# Patient Record
Sex: Male | Born: 1965 | Hispanic: Yes | Marital: Married | Smoking: Never smoker
Health system: Southern US, Community
[De-identification: ages and names within clinical notes are randomized; demographics above are authoritative.]

## PROBLEM LIST (undated history)

## (undated) DIAGNOSIS — N200 Calculus of kidney: Secondary | ICD-10-CM

---

## 2012-11-28 ENCOUNTER — Encounter (HOSPITAL_COMMUNITY): Payer: Self-pay | Admitting: *Deleted

## 2012-11-28 ENCOUNTER — Emergency Department (HOSPITAL_COMMUNITY): Payer: Self-pay

## 2012-11-28 ENCOUNTER — Ambulatory Visit (HOSPITAL_COMMUNITY)
Admission: EM | Admit: 2012-11-28 | Discharge: 2012-11-29 | Disposition: A | Discharge status: Home / Self Care | Payer: Self-pay | Attending: Orthopedic Surgery | Admitting: Orthopedic Surgery

## 2012-11-28 ENCOUNTER — Emergency Department (HOSPITAL_COMMUNITY): Payer: Self-pay | Admitting: *Deleted

## 2012-11-28 ENCOUNTER — Encounter (HOSPITAL_COMMUNITY)
Admission: EM | Disposition: A | Discharge status: Home / Self Care | Payer: Self-pay | Source: Home / Self Care | Attending: Emergency Medicine

## 2012-11-28 DIAGNOSIS — Y9269 Other specified industrial and construction area as the place of occurrence of the external cause: Secondary | ICD-10-CM | POA: Insufficient documentation

## 2012-11-28 DIAGNOSIS — S80859A Superficial foreign body, unspecified lower leg, initial encounter: Secondary | ICD-10-CM

## 2012-11-28 DIAGNOSIS — Z1810 Retained metal fragments, unspecified: Secondary | ICD-10-CM | POA: Insufficient documentation

## 2012-11-28 DIAGNOSIS — Y990 Civilian activity done for income or pay: Secondary | ICD-10-CM | POA: Insufficient documentation

## 2012-11-28 DIAGNOSIS — W278XXA Contact with other nonpowered hand tool, initial encounter: Secondary | ICD-10-CM | POA: Insufficient documentation

## 2012-11-28 DIAGNOSIS — Z23 Encounter for immunization: Secondary | ICD-10-CM | POA: Insufficient documentation

## 2012-11-28 DIAGNOSIS — S81009A Unspecified open wound, unspecified knee, initial encounter: Secondary | ICD-10-CM | POA: Insufficient documentation

## 2012-11-28 DIAGNOSIS — S91009A Unspecified open wound, unspecified ankle, initial encounter: Secondary | ICD-10-CM | POA: Insufficient documentation

## 2012-11-28 HISTORY — PX: KNEE ARTHROSCOPY: SHX127

## 2012-11-28 HISTORY — PX: I & D EXTREMITY: SHX5045

## 2012-11-28 SURGERY — IRRIGATION AND DEBRIDEMENT EXTREMITY
Anesthesia: General | Site: Knee | Laterality: Right | Wound class: Dirty or Infected

## 2012-11-28 MED ORDER — DIPHENHYDRAMINE HCL 12.5 MG/5ML PO ELIX: 12.5000 mg | ORAL_SOLUTION | ORAL | Status: DC | PRN

## 2012-11-28 MED ORDER — METOCLOPRAMIDE HCL 5 MG/ML IJ SOLN: 5.0000 mg | Freq: Three times a day (TID) | INTRAMUSCULAR | Status: DC | PRN

## 2012-11-28 MED ORDER — ONDANSETRON HCL 4 MG/2ML IJ SOLN: 4.0000 mg | Freq: Four times a day (QID) | INTRAMUSCULAR | Status: DC | PRN

## 2012-11-28 MED ORDER — SODIUM CHLORIDE 0.9 % IV BOLUS (SEPSIS)
1000.0000 mL | Freq: Once | INTRAVENOUS | Status: AC
  Administered 2012-11-28: 1000 mL via INTRAVENOUS

## 2012-11-28 MED ORDER — PROPOFOL 10 MG/ML IV BOLUS
INTRAVENOUS | Status: DC | PRN
  Administered 2012-11-28: 100 mg via INTRAVENOUS

## 2012-11-28 MED ORDER — PROMETHAZINE HCL 25 MG/ML IJ SOLN
6.2500 mg | INTRAMUSCULAR | Status: DC | PRN
Start: 2012-11-28 — End: 2012-11-29

## 2012-11-28 MED ORDER — CEFAZOLIN SODIUM 1-5 GM-% IV SOLN
1.0000 g | Freq: Once | INTRAVENOUS | Status: AC
  Administered 2012-11-28: 1 g via INTRAVENOUS
  Filled 2012-11-28: qty 50

## 2012-11-28 MED ORDER — ASPIRIN EC 325 MG PO TBEC
325.0000 mg | DELAYED_RELEASE_TABLET | Freq: Every day | ORAL | Status: DC
  Administered 2012-11-29: 325 mg via ORAL
  Filled 2012-11-28 (×2): qty 1

## 2012-11-28 MED ORDER — OXYCODONE HCL 5 MG/5ML PO SOLN: 5.0000 mg | Freq: Once | ORAL | Status: AC | PRN

## 2012-11-28 MED ORDER — DOCUSATE SODIUM 100 MG PO CAPS
100.0000 mg | ORAL_CAPSULE | Freq: Two times a day (BID) | ORAL | Status: DC
  Administered 2012-11-28 – 2012-11-29 (×2): 100 mg via ORAL
  Filled 2012-11-28 (×2): qty 1

## 2012-11-28 MED ORDER — MORPHINE SULFATE 4 MG/ML IJ SOLN
6.0000 mg | Freq: Once | INTRAMUSCULAR | Status: AC
  Administered 2012-11-28: 6 mg via INTRAVENOUS
  Filled 2012-11-28: qty 2

## 2012-11-28 MED ORDER — MENTHOL 3 MG MT LOZG: 1.0000 | LOZENGE | OROMUCOSAL | Status: DC | PRN

## 2012-11-28 MED ORDER — SUCCINYLCHOLINE CHLORIDE 20 MG/ML IJ SOLN
INTRAMUSCULAR | Status: DC | PRN
  Administered 2012-11-28: 140 mg via INTRAVENOUS

## 2012-11-28 MED ORDER — ONDANSETRON HCL 4 MG/2ML IJ SOLN
INTRAMUSCULAR | Status: DC | PRN
  Administered 2012-11-28: 4 mg via INTRAVENOUS

## 2012-11-28 MED ORDER — HYDROMORPHONE HCL PF 1 MG/ML IJ SOLN: 0.2500 mg | INTRAMUSCULAR | Status: DC | PRN

## 2012-11-28 MED ORDER — METHOCARBAMOL 500 MG PO TABS: 500.0000 mg | ORAL_TABLET | Freq: Four times a day (QID) | ORAL | Status: DC | PRN

## 2012-11-28 MED ORDER — DEXTROSE 5 % IV SOLN
500.0000 mg | Freq: Four times a day (QID) | INTRAVENOUS | Status: DC | PRN
  Filled 2012-11-28: qty 5

## 2012-11-28 MED ORDER — CEFAZOLIN SODIUM-DEXTROSE 2-3 GM-% IV SOLR
2.0000 g | Freq: Four times a day (QID) | INTRAVENOUS | Status: AC
Start: 2012-11-28 — End: 2012-11-29
  Administered 2012-11-28 – 2012-11-29 (×2): 2 g via INTRAVENOUS
  Filled 2012-11-28 (×2): qty 50

## 2012-11-28 MED ORDER — PHENOL 1.4 % MT LIQD: 1.0000 | OROMUCOSAL | Status: DC | PRN

## 2012-11-28 MED ORDER — ALUM & MAG HYDROXIDE-SIMETH 200-200-20 MG/5ML PO SUSP: 30.0000 mL | ORAL | Status: DC | PRN

## 2012-11-28 MED ORDER — BISACODYL 5 MG PO TBEC: 5.0000 mg | DELAYED_RELEASE_TABLET | Freq: Every day | ORAL | Status: DC | PRN

## 2012-11-28 MED ORDER — ACETAMINOPHEN 10 MG/ML IV SOLN
1000.0000 mg | Freq: Four times a day (QID) | INTRAVENOUS | Status: AC
  Administered 2012-11-28 – 2012-11-29 (×4): 1000 mg via INTRAVENOUS
  Filled 2012-11-28 (×4): qty 100

## 2012-11-28 MED ORDER — MIDAZOLAM HCL 2 MG/2ML IJ SOLN: 0.5000 mg | Freq: Once | INTRAMUSCULAR | Status: AC | PRN

## 2012-11-28 MED ORDER — SENNOSIDES-DOCUSATE SODIUM 8.6-50 MG PO TABS: 1.0000 | ORAL_TABLET | Freq: Every evening | ORAL | Status: DC | PRN

## 2012-11-28 MED ORDER — CELECOXIB 200 MG PO CAPS
200.0000 mg | ORAL_CAPSULE | Freq: Two times a day (BID) | ORAL | Status: DC
  Administered 2012-11-28 – 2012-11-29 (×2): 200 mg via ORAL
  Filled 2012-11-28 (×3): qty 1

## 2012-11-28 MED ORDER — FENTANYL CITRATE 0.05 MG/ML IJ SOLN
50.0000 ug | INTRAMUSCULAR | Status: DC | PRN
  Administered 2012-11-28: 100 ug via INTRAVENOUS

## 2012-11-28 MED ORDER — LACTATED RINGERS IV SOLN
INTRAVENOUS | Status: DC
  Administered 2012-11-28: 14:00:00 via INTRAVENOUS

## 2012-11-28 MED ORDER — OXYCODONE HCL 5 MG PO TABS
5.0000 mg | ORAL_TABLET | ORAL | Status: DC | PRN
  Administered 2012-11-28: 10 mg via ORAL
  Filled 2012-11-28: qty 2

## 2012-11-28 MED ORDER — OXYCODONE HCL 5 MG PO TABS: 5.0000 mg | ORAL_TABLET | Freq: Once | ORAL | Status: AC | PRN

## 2012-11-28 MED ORDER — LACTATED RINGERS IV SOLN
INTRAVENOUS | Status: DC | PRN
  Administered 2012-11-28: 14:00:00 via INTRAVENOUS

## 2012-11-28 MED ORDER — HYDROMORPHONE HCL PF 1 MG/ML IJ SOLN: 0.5000 mg | INTRAMUSCULAR | Status: DC | PRN

## 2012-11-28 MED ORDER — FENTANYL CITRATE 0.05 MG/ML IJ SOLN
INTRAMUSCULAR | Status: DC | PRN
  Administered 2012-11-28: 100 ug via INTRAVENOUS
  Administered 2012-11-28 (×2): 50 ug via INTRAVENOUS

## 2012-11-28 MED ORDER — MIDAZOLAM HCL 5 MG/5ML IJ SOLN
INTRAMUSCULAR | Status: DC | PRN
  Administered 2012-11-28: 2 mg via INTRAVENOUS

## 2012-11-28 MED ORDER — MEPERIDINE HCL 25 MG/ML IJ SOLN: 6.2500 mg | INTRAMUSCULAR | Status: DC | PRN

## 2012-11-28 MED ORDER — SODIUM CHLORIDE 0.9 % IV SOLN
INTRAVENOUS | Status: DC
  Administered 2012-11-28: 75 mL/h via INTRAVENOUS

## 2012-11-28 MED ORDER — MIDAZOLAM HCL 2 MG/2ML IJ SOLN: 1.0000 mg | INTRAMUSCULAR | Status: DC | PRN

## 2012-11-28 MED ORDER — SODIUM CHLORIDE 0.9 % IR SOLN
Status: DC | PRN
  Administered 2012-11-28 (×2): 3000 mL

## 2012-11-28 MED ORDER — ONDANSETRON HCL 4 MG/2ML IJ SOLN
4.0000 mg | Freq: Once | INTRAMUSCULAR | Status: AC
  Administered 2012-11-28: 4 mg via INTRAVENOUS
  Filled 2012-11-28: qty 2

## 2012-11-28 MED ORDER — PHENYLEPHRINE HCL 10 MG/ML IJ SOLN
INTRAMUSCULAR | Status: DC | PRN
  Administered 2012-11-28 (×3): 40 ug via INTRAVENOUS

## 2012-11-28 MED ORDER — METOCLOPRAMIDE HCL 10 MG PO TABS: 5.0000 mg | ORAL_TABLET | Freq: Three times a day (TID) | ORAL | Status: DC | PRN

## 2012-11-28 MED ORDER — ZOLPIDEM TARTRATE 5 MG PO TABS: 5.0000 mg | ORAL_TABLET | Freq: Every evening | ORAL | Status: DC | PRN

## 2012-11-28 MED ORDER — ONDANSETRON HCL 4 MG PO TABS: 4.0000 mg | ORAL_TABLET | Freq: Four times a day (QID) | ORAL | Status: DC | PRN

## 2012-11-28 MED ORDER — ACETAMINOPHEN 650 MG RE SUPP: 650.0000 mg | Freq: Four times a day (QID) | RECTAL | Status: DC | PRN

## 2012-11-28 MED ORDER — ACETAMINOPHEN 325 MG PO TABS: 650.0000 mg | ORAL_TABLET | Freq: Four times a day (QID) | ORAL | Status: DC | PRN

## 2012-11-28 SURGICAL SUPPLY — 56 items
BAG URINE DRAINAGE (UROLOGICAL SUPPLIES) IMPLANT
BANDAGE ELASTIC 6 VELCRO ST LF (GAUZE/BANDAGES/DRESSINGS) ×2 IMPLANT
BANDAGE ESMARK 6X9 LF (GAUZE/BANDAGES/DRESSINGS) ×1 IMPLANT
BLADE SURG 11 STRL SS (BLADE) ×2 IMPLANT
BNDG ESMARK 6X9 LF (GAUZE/BANDAGES/DRESSINGS) ×2
CLOTH BEACON ORANGE TIMEOUT ST (SAFETY) ×2 IMPLANT
CONT SPEC 4OZ CLIKSEAL STRL BL (MISCELLANEOUS) ×4 IMPLANT
COVER SURGICAL LIGHT HANDLE (MISCELLANEOUS) ×2 IMPLANT
CUFF TOURNIQUET SINGLE 34IN LL (TOURNIQUET CUFF) IMPLANT
DRAPE EXTREMITY T 121X128X90 (DRAPE) ×2 IMPLANT
DRAPE INCISE IOBAN 66X45 STRL (DRAPES) IMPLANT
DRAPE PROXIMA HALF (DRAPES) ×2 IMPLANT
DRAPE U-SHAPE 47X51 STRL (DRAPES) ×2 IMPLANT
DRSG ADAPTIC 3X8 NADH LF (GAUZE/BANDAGES/DRESSINGS) ×2 IMPLANT
DRSG PAD ABDOMINAL 8X10 ST (GAUZE/BANDAGES/DRESSINGS) ×4 IMPLANT
DURAPREP 26ML APPLICATOR (WOUND CARE) ×4 IMPLANT
ELECT REM PT RETURN 9FT ADLT (ELECTROSURGICAL) ×2
ELECTRODE REM PT RTRN 9FT ADLT (ELECTROSURGICAL) ×1 IMPLANT
EVACUATOR 1/8 PVC DRAIN (DRAIN) ×2 IMPLANT
GAUZE XEROFORM 1X8 LF (GAUZE/BANDAGES/DRESSINGS) ×2 IMPLANT
GLOVE BIOGEL PI IND STRL 7.5 (GLOVE) IMPLANT
GLOVE BIOGEL PI IND STRL 8.5 (GLOVE) ×2 IMPLANT
GLOVE BIOGEL PI INDICATOR 7.5 (GLOVE)
GLOVE BIOGEL PI INDICATOR 8.5 (GLOVE) ×2
GLOVE SURG ORTHO 7.0 STRL STRW (GLOVE) IMPLANT
GLOVE SURG ORTHO 8.0 STRL STRW (GLOVE) ×4 IMPLANT
GOWN PREVENTION PLUS XLARGE (GOWN DISPOSABLE) ×4 IMPLANT
GOWN STRL NON-REIN LRG LVL3 (GOWN DISPOSABLE) ×4 IMPLANT
HANDPIECE INTERPULSE COAX TIP (DISPOSABLE) ×1
KIT BASIN OR (CUSTOM PROCEDURE TRAY) ×2 IMPLANT
KIT ROOM TURNOVER OR (KITS) ×2 IMPLANT
MANIFOLD NEPTUNE II (INSTRUMENTS) ×2 IMPLANT
NS IRRIG 1000ML POUR BTL (IV SOLUTION) ×2 IMPLANT
PACK GENERAL/GYN (CUSTOM PROCEDURE TRAY) ×2 IMPLANT
PAD ARMBOARD 7.5X6 YLW CONV (MISCELLANEOUS) ×4 IMPLANT
PAD CAST 4YDX4 CTTN HI CHSV (CAST SUPPLIES) ×1 IMPLANT
PADDING CAST COTTON 4X4 STRL (CAST SUPPLIES) ×1
PADDING CAST COTTON 6X4 STRL (CAST SUPPLIES) ×2 IMPLANT
SET ARTHROSCOPY TUBING (MISCELLANEOUS) ×1
SET ARTHROSCOPY TUBING LN (MISCELLANEOUS) ×1 IMPLANT
SET HNDPC FAN SPRY TIP SCT (DISPOSABLE) ×1 IMPLANT
SPONGE GAUZE 4X4 12PLY (GAUZE/BANDAGES/DRESSINGS) ×2 IMPLANT
STAPLER VISISTAT 35W (STAPLE) ×2 IMPLANT
SUCTION FRAZIER TIP 10 FR DISP (SUCTIONS) ×2 IMPLANT
SUT ETHILON 4 0 PS 2 18 (SUTURE) ×2 IMPLANT
SUT VIC AB 0 CTB1 27 (SUTURE) ×4 IMPLANT
SUT VIC AB 1 CT1 27 (SUTURE)
SUT VIC AB 1 CT1 27XBRD ANBCTR (SUTURE) IMPLANT
SUT VIC AB 2-0 CT1 27 (SUTURE) ×2
SUT VIC AB 2-0 CT1 TAPERPNT 27 (SUTURE) ×2 IMPLANT
SYR 20CC LL (SYRINGE) ×4 IMPLANT
SYRINGE 10CC LL (SYRINGE) IMPLANT
TOWEL OR 17X24 6PK STRL BLUE (TOWEL DISPOSABLE) ×2 IMPLANT
TOWEL OR 17X26 10 PK STRL BLUE (TOWEL DISPOSABLE) ×2 IMPLANT
TRAY FOLEY CATH 14FR (SET/KITS/TRAYS/PACK) IMPLANT
WATER STERILE IRR 1000ML POUR (IV SOLUTION) ×6 IMPLANT

## 2012-11-28 NOTE — ED Notes (Signed)
1150 pt shot 3 inch nail to R knee.  Jeans nailed to R knee above R knee.  No bleeding.

## 2012-11-28 NOTE — Transfer of Care (Signed)
Immediate Anesthesia Transfer of Care Note  Patient: Rodney Mitchell  Procedure(s) Performed: Procedure(s) (LRB) with comments: IRRIGATION AND DEBRIDEMENT EXTREMITY (Right) ARTHROSCOPY KNEE (Right) - removal of foreign body  Patient Location: PACU  Anesthesia Type:General  Level of Consciousness: awake, alert  and oriented  Airway & Oxygen Therapy: Patient Spontanous Breathing and Patient connected to face mask oxygen  Post-op Assessment: Report given to PACU RN, Post -op Vital signs reviewed and stable and Patient moving all extremities  Post vital signs: Reviewed and stable  Complications: No apparent anesthesia complications

## 2012-11-28 NOTE — ED Provider Notes (Signed)
History     CSN: [id number]  Arrival date & time 11/28/12  1104   First MD Initiated Contact with Patient 11/28/12 1118      Chief Complaint  Patient presents with  . Knee Injury    nail gun to knee    (Consider location/radiation/quality/duration/timing/severity/associated sxs/prior treatment) HPI  Rodney Mitchell is a 47 y.o. male presents after3 inch nail shot into right knee from nail gun on construction site earlier today. Patient is unable to flex his knee due to increased pain. Pain rated as 5/10. Denies loss of sensation in right leg or decreased range of motion in ankle or hip. States that he may need to have tetanus booster. Denies numbness/paresthesia, shortness of breath, or chest pain. Patient last ate coffee and toast at 7:30 AM.  History reviewed. No pertinent past medical history.  History reviewed. No pertinent past surgical history.  No family history on file.  History  Substance Use Topics  . Smoking status: Never Smoker   . Smokeless tobacco: Not on file  . Alcohol Use: Yes     Comment: every weekend      Review of Systems  Constitutional: Negative for fever.  Respiratory: Negative for shortness of breath.   Cardiovascular: Negative for chest pain and palpitations.  Gastrointestinal: Negative for nausea, vomiting, abdominal pain and diarrhea.  Musculoskeletal: Positive for joint swelling.       Moderate edema noted around nail which is located medially 2 cm above right patella  Skin: Positive for wound.  All other systems reviewed and are negative.    Allergies  Review of patient's allergies indicates no known allergies.  Home Medications  No current outpatient prescriptions on file.  BP 128/71  Pulse 77  Temp 97.5 F (36.4 C) (Oral)  Resp 22  SpO2 98%  Physical Exam  Nursing note and vitals reviewed. Constitutional: He is oriented to person, place, and time. He appears well-developed and well-nourished. No distress.  HENT:  Head:  Normocephalic.  Eyes: Conjunctivae normal and EOM are normal.  Cardiovascular: Normal rate.   Pulmonary/Chest: Effort normal. No stridor.  Musculoskeletal: Normal range of motion.       Right knee: He exhibits swelling and abnormal patellar mobility. tenderness found.       Legs: Neurological: He is alert and oriented to person, place, and time. He has normal strength. No sensory deficit.  Skin:       3 inch nail penetration  Psychiatric: He has a normal mood and affect.    ED Course  Procedures (including critical care time)  Labs Reviewed - No data to display Dg Knee Right Port  11/28/2012  *RADIOLOGY REPORT*  Clinical Data: Nail gun injury.  PORTABLE RIGHT KNEE - 1-2 VIEW  Comparison: None.  Findings: There is a nail within the medial femoral condyle.  No visible fracture.  No joint effusion within the right knee.  IMPRESSION: Nail imbedded within the right medial femoral condyle.   Original Report Authenticated By: Charlett Nose, M.D.      1. Acute foreign body of knee       MDM  X-ray shows the knee fully embedded in the distal femur. Patient will be given a gram of Ancef IV access and pain control. Tetanus was also updated.  Ortho consult from Dr. Sherlean Foot appreciated: He will take the patient to the OR for foreign body removal.           Wynetta Emery, PA-C 11/28/12 1236

## 2012-11-28 NOTE — ED Notes (Signed)
Ortho at bedside, upon finishing consult will move patient to Pod C to hold.

## 2012-11-28 NOTE — ED Notes (Addendum)
Called OR. Ready for patient, gave report to Olegario Messier, Charity fundraiser. Consent obtained. Pt belongings will be sent with brother, Channing Mutters.

## 2012-11-28 NOTE — ED Provider Notes (Signed)
Medical screening examination/treatment/procedure(s) were performed by non-physician practitioner and as supervising physician I was immediately available for consultation/collaboration.  Keyon Liller, MD 11/28/12 2117 

## 2012-11-28 NOTE — Anesthesia Postprocedure Evaluation (Signed)
  Anesthesia Post-op Note  Patient: Rodney Mitchell  Procedure(s) Performed: Procedure(s) (LRB) with comments: IRRIGATION AND DEBRIDEMENT EXTREMITY (Right) ARTHROSCOPY KNEE (Right) - removal of foreign body  Patient Location: PACU  Anesthesia Type:General  Level of Consciousness: awake  Airway and Oxygen Therapy: Patient Spontanous Breathing  Post-op Pain: mild  Post-op Assessment: Post-op Vital signs reviewed  Post-op Vital Signs: Reviewed  Complications: No apparent anesthesia complications

## 2012-11-28 NOTE — Op Note (Signed)
Dictatation Number:  [id number]

## 2012-11-28 NOTE — H&P (Signed)
  Rodney Mitchell MRN:  [id number] DOB/SEX:  [birth year]/male  CHIEF COMPLAINT:  Painful right Knee  HISTORY: Rodney Mitchell is a 47 y.o. male presents after3 inch nail shot into right knee from nail gun on construction site earlier today. Patient is unable to flex his knee due to increased pain. Pain rated as 5/10. Denies loss of sensation in right leg or decreased range of motion in ankle or hip. States that he may need to have tetanus booster. Denies numbness/paresthesia, shortness of breath, or chest pain. Patient last ate coffee and toast at 7:30 AM.      PAST MEDICAL HISTORY: There are no active problems to display for this patient.  History reviewed. No pertinent past medical history. History reviewed. No pertinent past surgical history.   MEDICATIONS:   (Not in a hospital admission)  ALLERGIES:  No Known Allergies  REVIEW OF SYSTEMS:  Pertinent items are noted in HPI.   FAMILY HISTORY:  No family history on file.  SOCIAL HISTORY:   History  Substance Use Topics  . Smoking status: Never Smoker   . Smokeless tobacco: Not on file  . Alcohol Use: Yes     Comment: every weekend     EXAMINATION:  Vital signs in last 24 hours: Temp:  [97.5 F (36.4 C)] 97.5 F (36.4 C) (01/14 1110) Pulse Rate:  [70-82] 70  (01/14 1230) Resp:  [20-22] 20  (01/14 1208) BP: (105-128)/(65-89) 105/65 mmHg (01/14 1230) SpO2:  [97 %-100 %] 97 % (01/14 1230)  Lungs: clear to auscultation bilaterally Heart: regular rate and rhythm, S1, S2 normal, no murmur, click, rub or gallop Abdomen: soft, non-tender; bowel sounds normal; no masses,  no organomegaly Pulses: 2+ and symmetric  Musculoskeletal:  Decreased ROM, swelling foreign object interfering with patellar tracking  Imaging Review  2 view knee shows nail embedded in medial condyle  Assessment/Plan:  right knee pain foreign object  right knee foreign body removal and arthroscopic washout Rodney Mitchell 11/28/2012, 1:50 PM

## 2012-11-28 NOTE — Anesthesia Preprocedure Evaluation (Signed)
Anesthesia Evaluation  Patient identified by MRN, date of birth, ID band Patient awake    Reviewed: Allergy & Precautions, H&P , NPO status , Patient's Chart, lab work & pertinent test results  History of Anesthesia Complications Negative for: history of anesthetic complications  Airway Mallampati: II TM Distance: >3 FB Neck ROM: Full    Dental  (+) Teeth Intact and Dental Advisory Given   Pulmonary neg pulmonary ROS,  breath sounds clear to auscultation  Pulmonary exam normal       Cardiovascular negative cardio ROS  Rhythm:Regular Rate:Normal     Neuro/Psych negative neurological ROS  negative psych ROS   GI/Hepatic negative GI ROS, Neg liver ROS,   Endo/Other  negative endocrine ROS  Renal/GU negative Renal ROS     Musculoskeletal   Abdominal   Peds  Hematology negative hematology ROS (+)   Anesthesia Other Findings   Reproductive/Obstetrics                           Anesthesia Physical Anesthesia Plan  ASA: I and emergent  Anesthesia Plan: General   Post-op Pain Management:    Induction: Intravenous and Rapid sequence  Airway Management Planned: Oral ETT  Additional Equipment:   Intra-op Plan:   Post-operative Plan: Extubation in OR  Informed Consent: I have reviewed the patients History and Physical, chart, labs and discussed the procedure including the risks, benefits and alternatives for the proposed anesthesia with the patient or authorized representative who has indicated his/her understanding and acceptance.   Dental advisory given  Plan Discussed with: CRNA and Surgeon  Anesthesia Plan Comments: (Plan routine monitors, GETA with RSI)        Anesthesia Quick Evaluation

## 2012-11-29 LAB — BASIC METABOLIC PANEL
BUN: 16 mg/dL (ref 6–23)
CO2: 28 mEq/L (ref 19–32)
Chloride: 104 mEq/L (ref 96–112)
GFR calc non Af Amer: >90 mL/min (ref >90)
Glucose, Bld: 110 mg/dL — ABNORMAL HIGH (ref 70–99)
Potassium: 3.8 mEq/L (ref 3.5–5.1)

## 2012-11-29 LAB — CBC
HCT: 38.6 % — ABNORMAL LOW (ref 39.0–52.0)
Hemoglobin: 13.3 g/dL (ref 13.0–17.0)
MCHC: 34.5 g/dL (ref 30.0–36.0)
RBC: 4.43 MIL/uL (ref 4.22–5.81)

## 2012-11-29 MED ORDER — OXYCODONE HCL 5 MG PO TABS: 5.0000 mg | ORAL_TABLET | ORAL | Status: DC | PRN

## 2012-11-29 NOTE — Progress Notes (Signed)
Discharge instructions completed with patient using Teach back. Pateint's primary language is Spanish but he was able to tell me about his pain medication, dressing changes, the follow up appointment, and to keep the incision dry. These are the main teaching points for the discharge teaching. Waiting on crutches to be delivered to patients room!  Patient will then discharged with a wheelchair to private vehicle.

## 2012-11-29 NOTE — Discharge Summary (Signed)
Home Georgena Spurling, MD   Altamese Cabal, PA-C [address] [place], [place], [place]  [address]                             [phone]  PATIENT ID: Rodney Mitchell        MRN:  [id number]          DOB/AGE: [birth year] / 47 y.o.    DISCHARGE SUMMARY  ADMISSION DATE:    11/28/2012 DISCHARGE DATE:   11/29/2012   ADMISSION DIAGNOSIS: Acute foreign body of knee [916.6] NAIL GUN TO KNEE    DISCHARGE DIAGNOSIS:  Foreign body, right knee    ADDITIONAL DIAGNOSIS: Active Problems:  * No active hospital problems. *   History reviewed. No pertinent past medical history.  PROCEDURE: Procedure(s): IRRIGATION AND DEBRIDEMENT EXTREMITY ARTHROSCOPY KNEE on 11/28/2012  CONSULTS:     HISTORY:  See H&P in chart  HOSPITAL COURSE:  Rodney Mitchell is a 47 y.o. admitted on 11/28/2012 and found to have a diagnosis of Foreign body, right knee.  After appropriate laboratory studies were obtained  they were taken to the operating room on 11/28/2012 and underwent Procedure(s): IRRIGATION AND DEBRIDEMENT EXTREMITY ARTHROSCOPY KNEE.   They were given perioperative antibiotics:  Anti-infectives     Start     Dose/Rate Route Frequency Ordered Stop   11/28/12 1900   ceFAZolin (ANCEF) IVPB 2 g/50 mL premix        2 g 100 mL/hr over 30 Minutes Intravenous Every 6 hours 11/28/12 1823 11/29/12 0405   11/28/12 1145   ceFAZolin (ANCEF) IVPB 1 g/50 mL premix        1 g 100 mL/hr over 30 Minutes Intravenous  Once 11/28/12 1130 11/28/12 1249        .  Tolerated the procedure well.  POD #1, allowed out of bed to a chair.  PT for ambulation and exercise program.    .  The remainder of the hospital course was dedicated to ambulation and strengthening.   The patient was discharged on 1 Day Post-Op in  Good condition.  Blood products given:none  DIAGNOSTIC STUDIES: Recent vital signs: Patient Vitals for the past 24 hrs:  BP Temp Pulse Resp SpO2  11/29/12 0730 130/78 mmHg 97.8 F (36.6 C) 72  20  99 %  11/28/12  2332 126/74 mmHg 98.2 F (36.8 C) 73  20  99 %  11/28/12 1754 129/79 mmHg 98.1 F (36.7 C) 76  15  99 %  11/28/12 1724 125/83 mmHg 97.8 F (36.6 C) 66  13  98 %  11/28/12 1715 122/90 mmHg - 70  18  97 %  11/28/12 1700 118/50 mmHg - 67  18  98 %  11/28/12 1645 115/77 mmHg - 67  13  96 %  11/28/12 1630 113/76 mmHg - 70  13  96 %  11/28/12 1615 115/78 mmHg - 65  14  96 %  11/28/12 1600 117/82 mmHg - 69  14  96 %  11/28/12 1545 - - 67  16  97 %  11/28/12 1530 119/78 mmHg - 82  15  97 %  11/28/12 1518 126/84 mmHg 96.8 F (36 C) 85  16  95 %       Recent laboratory studies:  Basename 11/29/12 0801  WBC 7.2  HGB 13.3  HCT 38.6*  PLT 212    Basename 11/29/12 0801  NA 140  K 3.8  CL 104  CO2 28  BUN 16  CREATININE 0.84  GLUCOSE 110*  CALCIUM 8.7   No results found for this basename: INR, PROTIME     Recent Radiographic Studies :  Dg Knee Right Port  11/28/2012  *RADIOLOGY REPORT*  Clinical Data: Nail gun injury.  PORTABLE RIGHT KNEE - 1-2 VIEW  Comparison: None.  Findings: There is a nail within the medial femoral condyle.  No visible fracture.  No joint effusion within the right knee.  IMPRESSION: Nail imbedded within the right medial femoral condyle.   Original Report Authenticated By: Charlett Nose, M.D.     DISCHARGE INSTRUCTIONS: Discharge Orders    Future Orders Please Complete By Expires   Diet - low sodium heart healthy      Call MD / Call 911      Comments:   If you experience chest pain or shortness of breath, CALL 911 and be transported to the hospital emergency room.  If you develope a fever above 101 F, pus (white drainage) or increased drainage or redness at the wound, or calf pain, call your surgeon's office.   Constipation Prevention      Comments:   Drink plenty of fluids.  Prune juice may be helpful.  You may use a stool softener, such as Colace (over the counter) 100 mg twice a day.  Use MiraLax (over the counter) for constipation as needed.   Increase  activity slowly as tolerated         DISCHARGE MEDICATIONS:     Medication List     As of 11/29/2012  1:29 PM    TAKE these medications         oxyCODONE 5 MG immediate release tablet   Commonly known as: Oxy IR/ROXICODONE   Take 1-2 tablets (5-10 mg total) by mouth every 4 (four) hours as needed for pain.        FOLLOW UP VISIT:       Follow-up Information    Follow up with Raymon Mutton, MD. Call on 12/04/2012.   Contact information:   [address] [place] [address] [phone]          DISPOSITION:  home    CONDITION:  {Good  Rodney Mitchell 11/29/2012, 1:29 PM

## 2012-11-29 NOTE — Op Note (Signed)
NAMERENDER, MARLEY NO.:  [id number]  MEDICAL RECORD NO.:  [id number]  LOCATION:  5N22C                        FACILITY:  [facility]  PHYSICIAN:  Mila Homer. Sherlean Foot, M.D. DATE OF BIRTH:  10-01-1966  DATE OF PROCEDURE:  11/28/2012 DATE OF DISCHARGE:                              OPERATIVE REPORT   SURGEON:  Mila Homer. Sherlean Foot, M.D.  ASSISTANT:  None.  ANESTHESIA:  General.  PREOPERATIVE DIAGNOSIS:  Right knee foreign body.  PROCEDURE:  Right knee foreign body removal, arthroscopic irrigation and debridement.  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  I was obvious on x-ray of the nail was into the femur just missed the patella, but intra-articular.  Informed consent was obtained.  DESCRIPTION OF PROCEDURE:  The patient was laid supine and administered general anesthesia.  The right knee was prepped and draped in usual sterile fashion.  I did remove as much debris as possible and cleansed the nail prior to prep.  I then used a pair of pliers and worked the nail out of the femur.  I then ellipsed the edges and then made an arthroscopic incision in inferolateral portal position.  Placed a camera and then lavaged out the nail side.  I did place a small cannula through that nail tract to irrigated the knee and then back that out to irrigate further into the entry site.  I did this with 3000 mL of LR.  I then lavaged and closed the incisions with 4-0 nylon sutures.  Dressed with Xeroform dressing, sponges, sterile Webril, and an Ace wrap.          ______________________________ Mila Homer. Sherlean Foot, M.D.     SDL/MEDQ  D:  11/28/2012  T:  11/29/2012  Job:  [id number]

## 2012-11-29 NOTE — Progress Notes (Signed)
Utilization review completed. Parnell Spieler, RN, BSN. 

## 2012-11-29 NOTE — Progress Notes (Signed)
SPORTS MEDICINE AND JOINT REPLACEMENT  Georgena Spurling, MD   Altamese Cabal, PA-C [address] [place], [place], [place]  [address]                             [phone]   PROGRESS NOTE  Subjective:  negative for Chest Pain  negative for Shortness of Breath  negative for Nausea/Vomiting   negative for Calf Pain  negative for Bowel Movement   Tolerating Diet: yes         Patient reports pain as 4 on 0-10 scale.    Objective: Vital signs in last 24 hours:   Patient Vitals for the past 24 hrs:  BP Temp Pulse Resp SpO2  11/29/12 0730 130/78 mmHg 97.8 F (36.6 C) 72  20  99 %  11/28/12 2332 126/74 mmHg 98.2 F (36.8 C) 73  20  99 %  11/28/12 1754 129/79 mmHg 98.1 F (36.7 C) 76  15  99 %  11/28/12 1724 125/83 mmHg 97.8 F (36.6 C) 66  13  98 %  11/28/12 1715 122/90 mmHg - 70  18  97 %  11/28/12 1700 118/50 mmHg - 67  18  98 %  11/28/12 1645 115/77 mmHg - 67  13  96 %  11/28/12 1630 113/76 mmHg - 70  13  96 %  11/28/12 1615 115/78 mmHg - 65  14  96 %  11/28/12 1600 117/82 mmHg - 69  14  96 %  11/28/12 1545 - - 67  16  97 %  11/28/12 1530 119/78 mmHg - 82  15  97 %  11/28/12 1518 126/84 mmHg 96.8 F (36 C) 85  16  95 %    @flow {1959:LAST@   Intake/Output from previous day:   01/14 0701 - 01/15 0700 In: 1905 [P.O.:480; I.V.:1425] Out: 820 [Urine:800]   Intake/Output this shift:       Intake/Output      01/14 0701 - 01/15 0700 01/15 0701 - 01/16 0700   P.O. 480    I.V. 1425    Total Intake 1905    Urine 800    Blood 20    Total Output 820    Net +1085            LABORATORY DATA:  Basename 11/29/12 0801  WBC 7.2  HGB 13.3  HCT 38.6*  PLT 212    Basename 11/29/12 0801  NA 140  K 3.8  CL 104  CO2 28  BUN 16  CREATININE 0.84  GLUCOSE 110*  CALCIUM 8.7   No results found for this basename: INR, PROTIME    Examination:  General appearance: alert, cooperative and no distress Extremities: Homans sign is negative, no sign of DVT  Wound Exam:  clean, dry, intact   Drainage:  Scant/small amount Serosanguinous exudate  Motor Exam: EHL and FHL Intact  Sensory Exam: Deep Peroneal normal  Vascular Exam:    Assessment:    1 Day Post-Op  Procedure(s) (LRB): IRRIGATION AND DEBRIDEMENT EXTREMITY (Right) ARTHROSCOPY KNEE (Right)  ADDITIONAL DIAGNOSIS:  Active Problems:  * No active hospital problems. *   Acute Blood Loss Anemia   Plan: Occupational Therapy as ordered Weight Bearing as Tolerated (WBAT)  DVT Prophylaxis:  Aspirin  DISCHARGE PLAN: Home  DISCHARGE NEEDS:          Yannis Gumbs 11/29/2012, 1:23 PM

## 2012-11-30 ENCOUNTER — Encounter (HOSPITAL_COMMUNITY): Payer: Self-pay | Admitting: Orthopedic Surgery

## 2014-02-19 IMAGING — CR DG KNEE 1-2V PORT*R*
2 series · 2 of 2 positions shown · non-contrast
Comparison: None.

CLINICAL DATA: Nail gun injury.

PORTABLE RIGHT KNEE - 1-2 VIEW

[ap/obl knee]
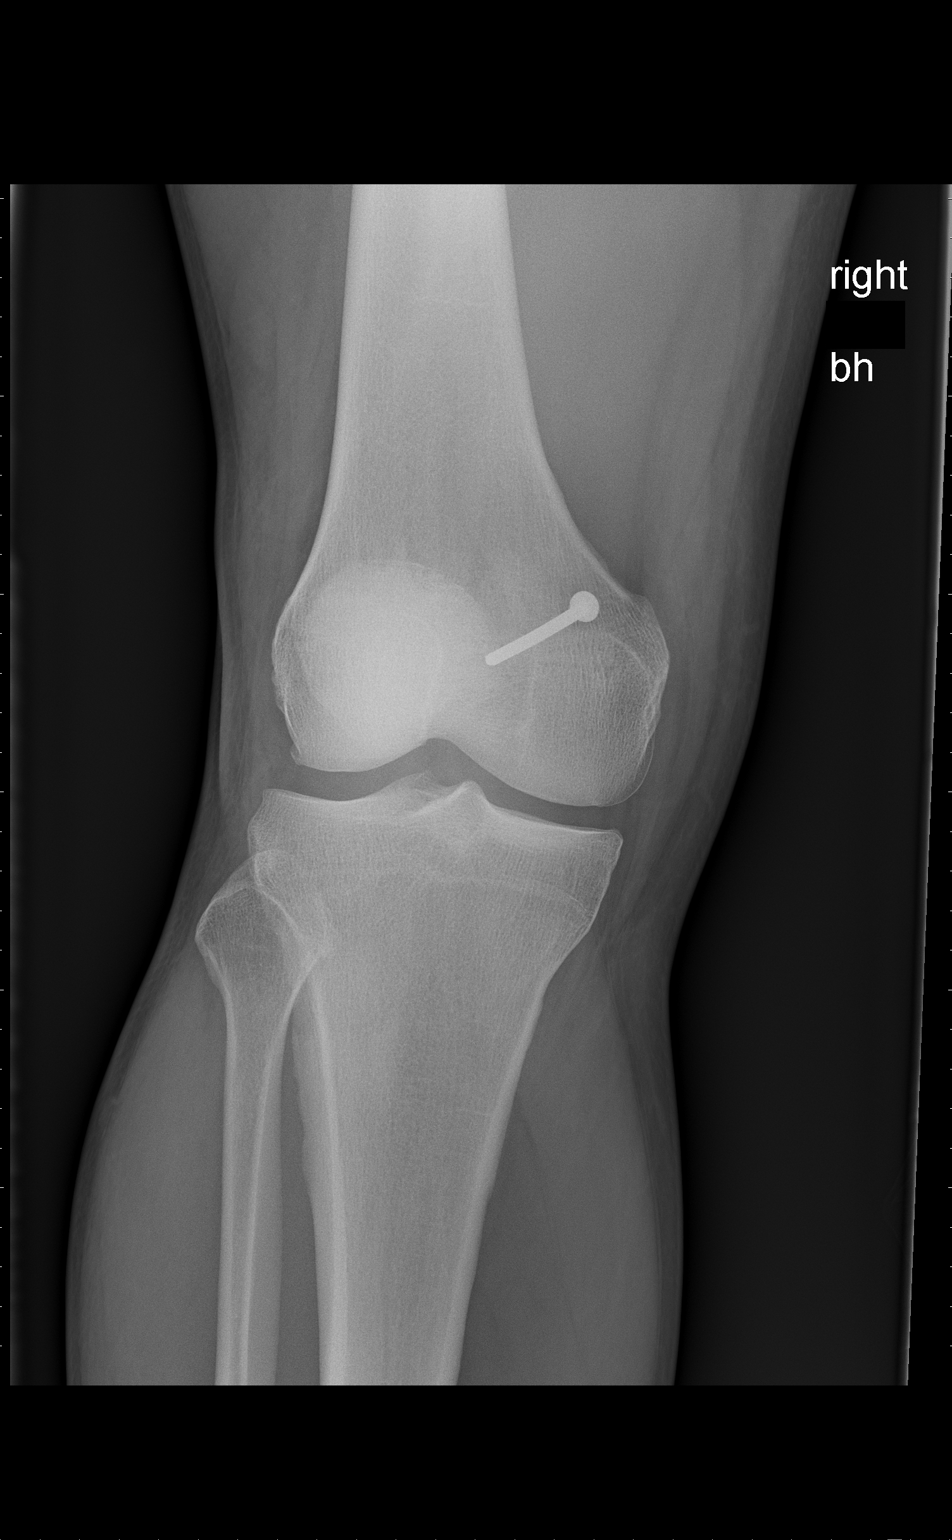

[knee lat]
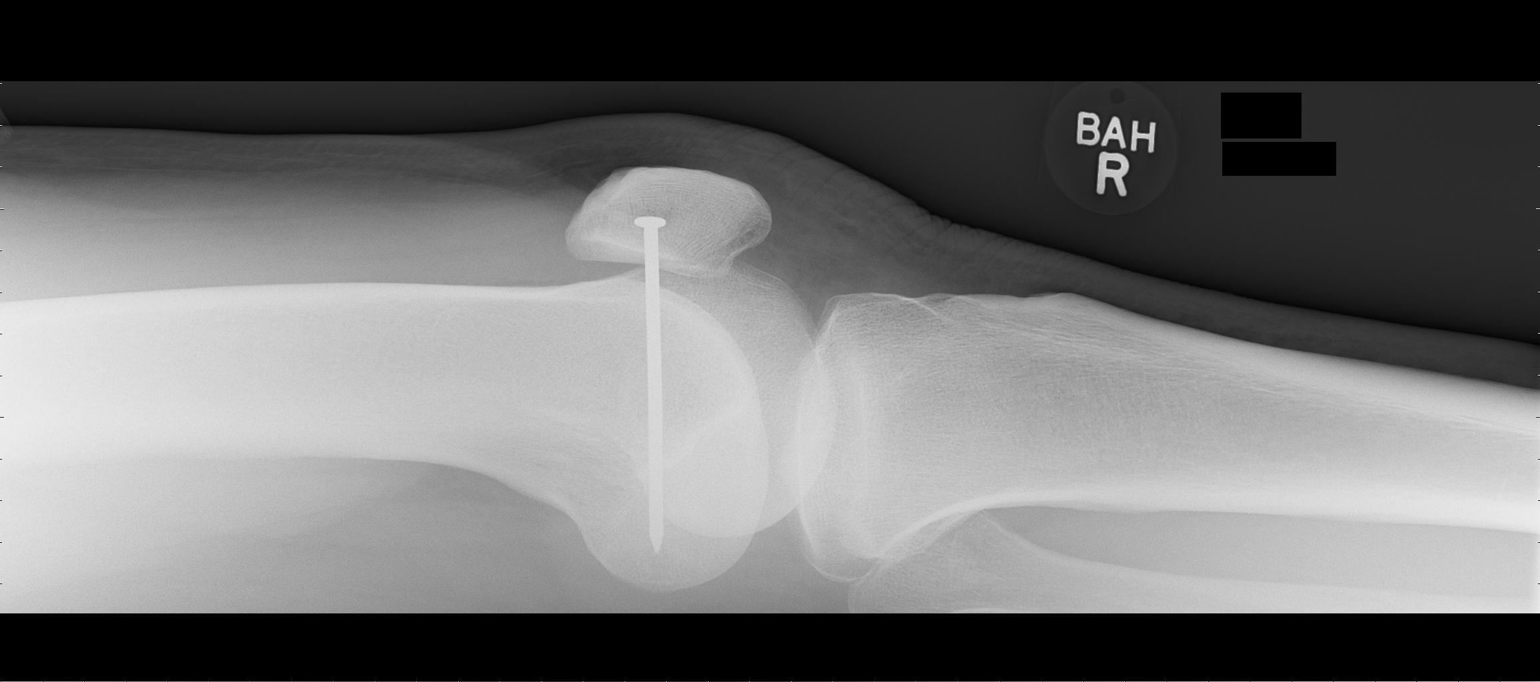

[2 of 2 positions shown; findings below may reference images not displayed]

FINDINGS: There is a nail within the medial femoral condyle.  No
visible fracture.  No joint effusion within the right knee.
IMPRESSION: Nail imbedded within the right medial femoral condyle.

## 2018-05-31 ENCOUNTER — Emergency Department (HOSPITAL_BASED_OUTPATIENT_CLINIC_OR_DEPARTMENT_OTHER): Payer: Self-pay

## 2018-05-31 ENCOUNTER — Other Ambulatory Visit: Payer: Self-pay

## 2018-05-31 ENCOUNTER — Encounter (HOSPITAL_BASED_OUTPATIENT_CLINIC_OR_DEPARTMENT_OTHER): Payer: Self-pay | Admitting: Emergency Medicine

## 2018-05-31 ENCOUNTER — Emergency Department (HOSPITAL_BASED_OUTPATIENT_CLINIC_OR_DEPARTMENT_OTHER)
Admission: EM | Admit: 2018-05-31 | Discharge: 2018-05-31 | Disposition: A | Discharge status: Home / Self Care | Payer: Self-pay | Attending: Emergency Medicine | Admitting: Emergency Medicine

## 2018-05-31 DIAGNOSIS — N201 Calculus of ureter: Secondary | ICD-10-CM | POA: Insufficient documentation

## 2018-05-31 HISTORY — DX: Calculus of kidney: N20.0

## 2018-05-31 LAB — URINALYSIS, MICROSCOPIC (REFLEX)

## 2018-05-31 LAB — URINALYSIS, ROUTINE W REFLEX MICROSCOPIC
Bilirubin Urine: NEGATIVE
Glucose, UA: NEGATIVE mg/dL
HGB URINE DIPSTICK: NEGATIVE
Ketones, ur: NEGATIVE mg/dL
Leukocytes, UA: NEGATIVE
NITRITE: NEGATIVE
PROTEIN: 30 mg/dL — AB
Specific Gravity, Urine: >1.03 — ABNORMAL HIGH (ref 1.005–1.030)
pH: 6 (ref 5.0–8.0)

## 2018-05-31 MED ORDER — KETOROLAC TROMETHAMINE 15 MG/ML IJ SOLN
15.0000 mg | Freq: Once | INTRAMUSCULAR | Status: AC
  Administered 2018-05-31: 15 mg via INTRAVENOUS
  Filled 2018-05-31: qty 1

## 2018-05-31 MED ORDER — HYDROMORPHONE HCL 1 MG/ML IJ SOLN
1.0000 mg | Freq: Once | INTRAMUSCULAR | Status: AC
  Administered 2018-05-31: 1 mg via INTRAVENOUS
  Filled 2018-05-31: qty 1

## 2018-05-31 MED ORDER — SODIUM CHLORIDE 0.9 % IV SOLN
Freq: Once | INTRAVENOUS | Status: AC
  Administered 2018-05-31: 02:00:00 via INTRAVENOUS

## 2018-05-31 MED ORDER — ONDANSETRON 8 MG PO TBDP: 8.0000 mg | ORAL_TABLET | Freq: Three times a day (TID) | ORAL | 0 refills | Status: AC | PRN

## 2018-05-31 MED ORDER — ONDANSETRON HCL 4 MG/2ML IJ SOLN
4.0000 mg | Freq: Once | INTRAMUSCULAR | Status: AC
  Administered 2018-05-31: 4 mg via INTRAVENOUS
  Filled 2018-05-31: qty 2

## 2018-05-31 MED ORDER — HYDROMORPHONE HCL 2 MG PO TABS: 2.0000 mg | ORAL_TABLET | ORAL | 0 refills | Status: AC | PRN

## 2018-05-31 NOTE — ED Provider Notes (Addendum)
[facility] Provider Note: [name] Matty Deamer, MD, FACEP  CSN: [id number] MRN: [id number] ARRIVAL: 05/31/18 at 0156 ROOM: MH10/MH10   CHIEF COMPLAINT  Flank Pain   HISTORY OF PRESENT ILLNESS  05/31/18 2:17 AM Rodney HurstJuan Mitchell is a 52 y.o. male with a history of kidney stones.  He is here with right flank pain that began several hours ago.  The pain is moderate to severe and is characterizes like previous kidney stones.  It is not significantly affected by movement.  He has had associated nausea and vomiting.  He has not noticed hematuria.  He also recently injured his right hand at work and had x-rays of his right hand and wrist yesterday.  The results were reviewed and they show no acute bony abnormality.   Past Medical History:  Diagnosis Date  . Kidney stones     Past Surgical History:  Procedure Laterality Date  . I&D EXTREMITY  11/28/2012   Procedure: IRRIGATION AND DEBRIDEMENT EXTREMITY;  Surgeon: Dannielle HuhSteve Lucey, MD;  Location: [facility] OR;  Service: Orthopedics;  Laterality: Right;  . KNEE ARTHROSCOPY  11/28/2012   Procedure: ARTHROSCOPY KNEE;  Surgeon: Dannielle HuhSteve Lucey, MD;  [facility];  Service: Orthopedics;  Laterality: Right;  removal of foreign body    History reviewed. No pertinent family history.  Social History   Tobacco Use  . Smoking status: Never Smoker  . Smokeless tobacco: Never Used  Substance Use Topics  . Alcohol use: Yes    Comment: every weekend  . Drug use: No    Prior to Admission medications   Medication Sig Start Date End Date Taking? Authorizing Provider  oxyCODONE (OXY IR/ROXICODONE) 5 MG immediate release tablet Take 1-2 tablets (5-10 mg total) by mouth every 4 (four) hours as needed for pain. 11/29/12   Altamese CabalJones, Maurice, PA-C    Allergies Patient has no known allergies.   REVIEW OF SYSTEMS  Negative except as noted here or in the History of Present Illness.   PHYSICAL EXAMINATION  Initial Vital Signs Blood pressure (!) 152/98, pulse  72, temperature 98.1 F (36.7 C), temperature source Oral, resp. rate 20, height 5\' 10"  (1.778 m), weight 111.1 kg (245 lb), SpO2 98 %.  Examination General: Well-developed, well-nourished male in no acute distress; appearance consistent with age of record HENT: normocephalic; atraumatic Eyes: pupils equal, round and reactive to light; extraocular muscles intact Neck: supple Heart: regular rate and rhythm Lungs: clear to auscultation bilaterally Abdomen: soft; nondistended; nontender; bowel sounds present GU: No CVA tenderness Extremities: No deformity; tenderness and swelling of dorsal right hand Neurologic: Awake, alert; motor function intact in all extremities and symmetric; no facial droop Skin: Warm and dry Psychiatric: Flat affect   RESULTS  Summary of this visit's results, reviewed by myself:   EKG Interpretation  Date/Time:    Ventricular Rate:    PR Interval:    QRS Duration:   QT Interval:    QTC Calculation:   R Axis:     Text Interpretation:        Laboratory Studies: Results for orders placed or performed during the hospital encounter of 05/31/18 (from the past 24 hour(s))  Urinalysis, Routine w reflex microscopic     Status: Abnormal   Collection Time: 05/31/18  3:45 AM  Result Value Ref Range   Color, Urine YELLOW YELLOW   APPearance CLEAR CLEAR   Specific Gravity, Urine >1.030 (H) 1.005 - 1.030   pH 6.0 5.0 - 8.0   Glucose, UA NEGATIVE NEGATIVE mg/dL  Hgb urine dipstick NEGATIVE NEGATIVE   Bilirubin Urine NEGATIVE NEGATIVE   Ketones, ur NEGATIVE NEGATIVE mg/dL   Protein, ur 30 (A) NEGATIVE mg/dL   Nitrite NEGATIVE NEGATIVE   Leukocytes, UA NEGATIVE NEGATIVE  Urinalysis, Microscopic (reflex)     Status: Abnormal   Collection Time: 05/31/18  3:45 AM  Result Value Ref Range   RBC / HPF 0-5 0 - 5 RBC/hpf   WBC, UA 0-5 0 - 5 WBC/hpf   Bacteria, UA RARE (A) NONE SEEN   Squamous Epithelial / LPF 0-5 0 - 5   Mucus PRESENT    Amorphous Crystal PRESENT     Imaging Studies: Ct Renal Stone Study  Result Date: 05/31/2018 CLINICAL DATA:  52 year old male with right flank pain. EXAM: CT ABDOMEN AND PELVIS WITHOUT CONTRAST TECHNIQUE: Multidetector CT imaging of the abdomen and pelvis was performed following the standard protocol without IV contrast. COMPARISON:  None. FINDINGS: Evaluation of this exam is limited in the absence of intravenous contrast. Lower chest: The visualized lung bases are clear. No intra-abdominal free air or free fluid. Hepatobiliary: Diffuse fatty liver. No intrahepatic biliary duct dilatation. The gallbladder is unremarkable. Pancreas: Unremarkable. No pancreatic ductal dilatation or surrounding inflammatory changes. Spleen: Normal in size without focal abnormality. Adrenals/Urinary Tract: The adrenal glands are unremarkable. There is a 2 mm stone at the right ureterovesical junction with mild right hydronephrosis. Punctate nonobstructing left renal calculi noted. There is no hydronephrosis on the left. Stomach/Bowel: There is sigmoid diverticulosis without active inflammatory changes. Multiple duodenal diverticula measures 2 cm. There is no bowel obstruction or active inflammation. Normal caliber fecalized small bowel loops may represent increased transit time or small intestinal bacterial overgrowth. The appendix is normal. Vascular/Lymphatic: The abdominal aorta and IVC appear unremarkable on this noncontrast CT. No portal venous gas. There is no adenopathy. Reproductive: The prostate and seminal vesicles are grossly unremarkable. No pelvic mass. Other: Small fat containing umbilical hernia Musculoskeletal: No acute or significant osseous findings. IMPRESSION: 1. A 2 mm right UVJ stone with mild right hydronephrosis. 2. Colonic and duodenal diverticulosis. No bowel obstruction or active inflammation. Normal appendix. 3. Fatty liver. Electronically Signed   By: Elgie Collard M.D.   On: 05/31/2018 02:53    ED COURSE and MDM  Nursing  notes and initial vitals signs, including pulse oximetry, reviewed.  Vitals:   05/31/18 0204 05/31/18 0205  BP: (!) 152/98   Pulse: 72   Resp: 20   Temp: 98.1 F (36.7 C)   TempSrc: Oral   SpO2: 98%   Weight:  111.1 kg (245 lb)  Height:  5\' 10"  (1.778 m)   4:09 AM Pain well controlled.  Will discharge home on nausea and pain medication.  2 mm he should pass it on his own but we will refer to urology should symptoms persist.  Consultation with the [facility] state controlled substances database reveals the patient has received no opioid prescriptions in the past 2 years.  PROCEDURES    ED DIAGNOSES     ICD-10-CM   1. Ureterolithiasis N20.1        Estie Sproule, MD 05/31/18 0410    Paula Libra, MD 05/31/18 1610

## 2018-05-31 NOTE — ED Triage Notes (Signed)
Pt is c/o right flank pain that started a while ago  Pt has nausea without vomiting  Pt has hx of kidney stones and states this feels the same

## 2022-11-03 ENCOUNTER — Emergency Department (HOSPITAL_BASED_OUTPATIENT_CLINIC_OR_DEPARTMENT_OTHER): Payer: Self-pay

## 2022-11-03 ENCOUNTER — Emergency Department (HOSPITAL_BASED_OUTPATIENT_CLINIC_OR_DEPARTMENT_OTHER)
Admission: EM | Admit: 2022-11-03 | Discharge: 2022-11-03 | Disposition: A | Discharge status: Home / Self Care | Payer: Self-pay | Attending: Emergency Medicine | Admitting: Emergency Medicine

## 2022-11-03 ENCOUNTER — Encounter (HOSPITAL_BASED_OUTPATIENT_CLINIC_OR_DEPARTMENT_OTHER): Payer: Self-pay

## 2022-11-03 DIAGNOSIS — J45901 Unspecified asthma with (acute) exacerbation: Secondary | ICD-10-CM | POA: Insufficient documentation

## 2022-11-03 DIAGNOSIS — Z20822 Contact with and (suspected) exposure to covid-19: Secondary | ICD-10-CM | POA: Insufficient documentation

## 2022-11-03 LAB — RESP PANEL BY RT-PCR (RSV, FLU A&B, COVID)  RVPGX2
Influenza A by PCR: NEGATIVE
Influenza B by PCR: NEGATIVE
Resp Syncytial Virus by PCR: NEGATIVE
SARS Coronavirus 2 by RT PCR: NEGATIVE

## 2022-11-03 MED ORDER — IPRATROPIUM-ALBUTEROL 0.5-2.5 (3) MG/3ML IN SOLN
3.0000 mL | Freq: Once | RESPIRATORY_TRACT | Status: AC | PRN
  Administered 2022-11-03: 3 mL via RESPIRATORY_TRACT
  Filled 2022-11-03: qty 3

## 2022-11-03 MED ORDER — PREDNISONE 10 MG PO TABS
60.0000 mg | ORAL_TABLET | Freq: Once | ORAL | Status: AC
  Administered 2022-11-03: 60 mg via ORAL
  Filled 2022-11-03: qty 1

## 2022-11-03 MED ORDER — ALBUTEROL SULFATE HFA 108 (90 BASE) MCG/ACT IN AERS
2.0000 | INHALATION_SPRAY | Freq: Once | RESPIRATORY_TRACT | Status: AC
  Administered 2022-11-03: 2 via RESPIRATORY_TRACT
  Filled 2022-11-03: qty 6.7

## 2022-11-03 MED ORDER — PREDNISONE 10 MG PO TABS: 60.0000 mg | ORAL_TABLET | Freq: Every day | ORAL | 0 refills | Status: AC

## 2022-11-03 MED ORDER — IPRATROPIUM-ALBUTEROL 0.5-2.5 (3) MG/3ML IN SOLN
3.0000 mL | Freq: Once | RESPIRATORY_TRACT | Status: AC
  Administered 2022-11-03: 3 mL via RESPIRATORY_TRACT
  Filled 2022-11-03: qty 3

## 2022-11-03 MED ORDER — ALBUTEROL SULFATE (2.5 MG/3ML) 0.083% IN NEBU: 2.5000 mg | INHALATION_SOLUTION | Freq: Four times a day (QID) | RESPIRATORY_TRACT | 12 refills | Status: AC | PRN

## 2022-11-03 NOTE — ED Provider Notes (Signed)
[facility] EMERGENCY DEPARTMENT Provider Note   CSN: [id number] Arrival date & time: 11/03/22  2229     History  Chief Complaint  Patient presents with   Cough    Rodney Mitchell is a 56 y.o. male presented emergency department in the company of his family with concern for worsening cough and wheezing.  The patient reports that he was diagnosed with COVID 2 years ago and ever since she had the COVID viral illness, he has had reactive airway disease, with frequent fits of coughing especially with weather changes.  He keeps an albuterol inhaler at home but has not been helping him.  His breathing is worsened in the past 2 days.  He is here with his son and wife at the bedside.  He reports no other medical issues including diabetes, smoking history.  HPI     Home Medications Prior to Admission medications   Medication Sig Start Date End Date Taking? Authorizing Provider  albuterol (PROVENTIL) (2.5 MG/3ML) 0.083% nebulizer solution Take 3 mLs (2.5 mg total) by nebulization every 6 (six) hours as needed for wheezing or shortness of breath. 11/03/22  Yes Tanveer Brammer, Kermit Balo, MD  predniSONE (DELTASONE) 10 MG tablet Take 6 tablets (60 mg total) by mouth daily with breakfast for 5 days. 11/04/22 11/09/22 Yes Chidera Dearcos, Kermit Balo, MD  HYDROmorphone (DILAUDID) 2 MG tablet Take 1 tablet (2 mg total) by mouth every 4 (four) hours as needed for severe pain. 05/31/18   Molpus, Jonny Ruiz, MD  ondansetron (ZOFRAN ODT) 8 MG disintegrating tablet Take 1 tablet (8 mg total) by mouth every 8 (eight) hours as needed for nausea or vomiting. 05/31/18   Molpus, Jonny Ruiz, MD      Allergies    Patient has no known allergies.    Review of Systems   Review of Systems  Physical Exam Updated Vital Signs BP 133/83   Pulse 86   Temp 98.2 F (36.8 C) (Oral)   Resp 18   Ht 5\' 11"  (1.803 m)   Wt 108.9 kg   SpO2 99%   BMI 33.47 kg/m  Physical Exam Constitutional:      General: He is not in acute distress. HENT:      Head: Normocephalic and atraumatic.  Eyes:     Conjunctiva/sclera: Conjunctivae normal.     Pupils: Pupils are equal, round, and reactive to light.  Cardiovascular:     Rate and Rhythm: Normal rate and regular rhythm.  Pulmonary:     Effort: Pulmonary effort is normal. No respiratory distress.     Breath sounds: Wheezing present.  Abdominal:     General: There is no distension.     Tenderness: There is no abdominal tenderness.  Skin:    General: Skin is warm and dry.  Neurological:     General: No focal deficit present.     Mental Status: He is alert. Mental status is at baseline.  Psychiatric:        Mood and Affect: Mood normal.        Behavior: Behavior normal.     ED Results / Procedures / Treatments   Labs (all labs ordered are listed, but only abnormal results are displayed) Labs Reviewed  RESP PANEL BY RT-PCR (RSV, FLU A&B, COVID)  [id number]    EKG None  Radiology DG Chest Portable 1 View  Result Date: 11/03/2022 CLINICAL DATA:  cough/shortness of breath x 2 weeks EXAM: PORTABLE CHEST - 1 VIEW COMPARISON:  None Available. FINDINGS: Cardiac silhouette is  unremarkable. No pneumothorax or pleural effusion. The lungs are clear. The visualized skeletal structures are unremarkable. IMPRESSION: No acute cardiopulmonary process. Electronically Signed   By: Layla Maw M.D.   On: 11/03/2022 08:39    Procedures Procedures    Medications Ordered in ED Medications  ipratropium-albuterol (DUONEB) 0.5-2.5 (3) MG/3ML nebulizer solution 3 mL (3 mLs Nebulization Given 11/03/22 0801)  predniSONE (DELTASONE) tablet 60 mg (60 mg Oral Given 11/03/22 0910)  ipratropium-albuterol (DUONEB) 0.5-2.5 (3) MG/3ML nebulizer solution 3 mL (3 mLs Nebulization Given 11/03/22 0908)  ipratropium-albuterol (DUONEB) 0.5-2.5 (3) MG/3ML nebulizer solution 3 mL (3 mLs Nebulization Given 11/03/22 1011)  albuterol (VENTOLIN HFA) 108 (90 Base) MCG/ACT inhaler 2 puff (2 puffs Inhalation Given  11/03/22 1023)    ED Course/ Medical Decision Making/ A&P Clinical Course as of 11/03/22 1031  Wed Nov 03, 2022  1001 Patient reassessed some overall improvement in his breathing is symptomatically feeling better, but we will give him 1 more DuoNeb prior to discharge.  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  He and his son and his family verbalized understanding [MT]    Clinical Course User Index [MT] Susumu Hackler, Kermit Balo, MD                           Medical Decision Making Amount and/or Complexity of Data Reviewed Radiology: ordered.  Risk Prescription drug management.   Patient is here with coughing and wheezing.  Differential would include viral URI versus asthma or reactive airway disease versus other.  I personally reviewed and interpreted the patient's x-ray imaging which showed no focal infiltrate or evidence of pneumonia.  I do not see an indication for antibiotics.  The was given DuoNebs and steroids as he was wheezing on exam consistent with asthma or reactive airway disease.  On reassessment the wheezing was improved and he symptomatically felt better.  I will prescribe steroids for home as well as nebulizer medications, and they will consider buying 1 over-the-counter to have a machine at home.          Final Clinical Impression(s) / ED Diagnoses Final diagnoses:  Exacerbation of asthma, unspecified asthma severity, unspecified whether persistent    Rx / DC Orders ED Discharge Orders          Ordered    predniSONE (DELTASONE) 10 MG tablet  Daily with breakfast        11/03/22 0908    albuterol (PROVENTIL) (2.5 MG/3ML) 0.083% nebulizer solution  Every 6 hours PRN        11/03/22 0908              Terald Sleeper, MD 11/03/22 1031

## 2022-11-03 NOTE — Discharge Instructions (Addendum)
Please consider buying a nebulizer machine either over-the-counter at [facility] or pharmacy, or else ordering online for a website such as [employer].  I prescribed you the medication that is a liquid that you put inside the machine to give yourself breathing treatments.  You should give yourself a breathing treatment every 4 hours as needed when you are wheezing.

## 2022-11-03 NOTE — ED Triage Notes (Signed)
C/o cough, "wheezing", headache, shortness of breath, pain in chest when coughing x 1-2 weeks.

## 2022-11-20 ENCOUNTER — Emergency Department (HOSPITAL_BASED_OUTPATIENT_CLINIC_OR_DEPARTMENT_OTHER)
Admission: EM | Admit: 2022-11-20 | Discharge: 2022-11-20 | Disposition: A | Discharge status: Home / Self Care | Payer: Managed Care, Other (non HMO) | Attending: Emergency Medicine | Admitting: Emergency Medicine

## 2022-11-20 ENCOUNTER — Emergency Department (HOSPITAL_BASED_OUTPATIENT_CLINIC_OR_DEPARTMENT_OTHER): Payer: Managed Care, Other (non HMO)

## 2022-11-20 ENCOUNTER — Other Ambulatory Visit: Payer: Self-pay

## 2022-11-20 ENCOUNTER — Encounter (HOSPITAL_BASED_OUTPATIENT_CLINIC_OR_DEPARTMENT_OTHER): Payer: Self-pay | Admitting: Urology

## 2022-11-20 DIAGNOSIS — J4541 Moderate persistent asthma with (acute) exacerbation: Secondary | ICD-10-CM | POA: Insufficient documentation

## 2022-11-20 DIAGNOSIS — R0602 Shortness of breath: Secondary | ICD-10-CM | POA: Diagnosis present

## 2022-11-20 MED ORDER — IPRATROPIUM-ALBUTEROL 0.5-2.5 (3) MG/3ML IN SOLN
3.0000 mL | RESPIRATORY_TRACT | Status: AC
  Administered 2022-11-20: 3 mL via RESPIRATORY_TRACT
  Filled 2022-11-20: qty 3

## 2022-11-20 MED ORDER — PREDNISONE 20 MG PO TABS: ORAL_TABLET | ORAL | 0 refills | Status: DC

## 2022-11-20 MED ORDER — ALBUTEROL SULFATE HFA 108 (90 BASE) MCG/ACT IN AERS
2.0000 | INHALATION_SPRAY | RESPIRATORY_TRACT | Status: DC | PRN
  Administered 2022-11-20: 2 via RESPIRATORY_TRACT
  Filled 2022-11-20: qty 6.7

## 2022-11-20 MED ORDER — PREDNISONE 50 MG PO TABS
60.0000 mg | ORAL_TABLET | Freq: Once | ORAL | Status: AC
  Administered 2022-11-20: 60 mg via ORAL
  Filled 2022-11-20: qty 1

## 2022-11-20 MED ORDER — IPRATROPIUM-ALBUTEROL 0.5-2.5 (3) MG/3ML IN SOLN
3.0000 mL | RESPIRATORY_TRACT | Status: AC
  Administered 2022-11-20 (×2): 3 mL via RESPIRATORY_TRACT
  Filled 2022-11-20: qty 6

## 2022-11-20 NOTE — Discharge Instructions (Signed)
Use your inhaler every 4 hours(6 puffs) while awake, return for sudden worsening shortness of breath, or if you need to use your inhaler more often.  ° °

## 2022-11-20 NOTE — ED Notes (Addendum)
Per Richardson Landry, RT pt has bilateral rhonchi with RR 28

## 2022-11-20 NOTE — ED Provider Notes (Signed)
[facility] EMERGENCY DEPARTMENT Provider Note   CSN: [id number] Arrival date & time: 11/20/22  1703     History  Chief Complaint  Patient presents with   Shortness of Breath    Rodney Mitchell is a 57 y.o. male.  57 yo M with a chief complaints of difficulty breathing.  Says like this is a chronic problem for him.  He has had difficulty breathing for some months now.  Had been seen in the ED prior to this and was found to have likely reactive airway disease.  Improved with beta agonist therapy and then once that medicine wears off he feels short of breath again.  He has been coughing up.  Denies fevers or chills.  He has a primary care provider in [place] but was unable to make it back to see him over the past couple months.   Shortness of Breath      Home Medications Prior to Admission medications   Medication Sig Start Date End Date Taking? Authorizing Provider  predniSONE (DELTASONE) 20 MG tablet 2 tabs po daily x 4 days 11/20/22  Yes Deno Etienne, DO  albuterol (PROVENTIL) (2.5 MG/3ML) 0.083% nebulizer solution Take 3 mLs (2.5 mg total) by nebulization every 6 (six) hours as needed for wheezing or shortness of breath. 11/03/22   Wyvonnia Dusky, MD  HYDROmorphone (DILAUDID) 2 MG tablet Take 1 tablet (2 mg total) by mouth every 4 (four) hours as needed for severe pain. 05/31/18   Molpus, Jenny Reichmann, MD  ondansetron (ZOFRAN ODT) 8 MG disintegrating tablet Take 1 tablet (8 mg total) by mouth every 8 (eight) hours as needed for nausea or vomiting. 05/31/18   Molpus, Jenny Reichmann, MD      Allergies    Patient has no known allergies.    Review of Systems   Review of Systems  Respiratory:  Positive for shortness of breath.     Physical Exam Updated Vital Signs BP 131/78   Pulse (!) 109   Temp 98 F (36.7 C)   Resp (!) 34   Ht 5\' 11"  (1.803 m)   Wt 108.8 kg   SpO2 95%   BMI 33.45 kg/m  Physical Exam Vitals and nursing note reviewed.  Constitutional:      Appearance: He is  well-developed.  HENT:     Head: Normocephalic and atraumatic.  Eyes:     Pupils: Pupils are equal, round, and reactive to light.  Neck:     Vascular: No JVD.  Cardiovascular:     Rate and Rhythm: Normal rate and regular rhythm.     Heart sounds: No murmur heard.    No friction rub. No gallop.  Pulmonary:     Effort: No respiratory distress.     Breath sounds: Wheezing present.     Comments: Diffuse wheezes in all lung fields.  Prolonged expiratory effort. Abdominal:     General: There is no distension.     Tenderness: There is no abdominal tenderness. There is no guarding or rebound.  Musculoskeletal:        General: Normal range of motion.     Cervical back: Normal range of motion and neck supple.  Skin:    Coloration: Skin is not pale.     Findings: No rash.  Neurological:     Mental Status: He is alert and oriented to person, place, and time.  Psychiatric:        Behavior: Behavior normal.     ED Results / Procedures /  Treatments   Labs (all labs ordered are listed, but only abnormal results are displayed) Labs Reviewed - No data to display  EKG EKG Interpretation  Date/Time:  Saturday November 20 2022 17:15:06 EST Ventricular Rate:  99 PR Interval:    QRS Duration: 109 QT Interval:  352 QTC Calculation: 452 R Axis:   61 Text Interpretation: Normal sinus rhythm RSR' in V1 or V2, right VCD or RVH No old tracing to compare Confirmed by Melene Plan [phone]) on 11/20/2022 5:28:19 PM  Radiology DG Chest 2 View  Result Date: 11/20/2022 CLINICAL DATA:  SOB EXAM: CHEST - 2 VIEW COMPARISON:  11/03/2022 FINDINGS: Cardiac silhouette is unremarkable. No pneumothorax or pleural effusion. The lungs are clear. The visualized skeletal structures are unremarkable. IMPRESSION: No acute cardiopulmonary process. Electronically Signed   By: Layla Maw M.D.   On: 11/20/2022 17:31    Procedures Procedures    Medications Ordered in ED Medications  albuterol (VENTOLIN HFA) 108  (90 Base) MCG/ACT inhaler 2 puff (2 puffs Inhalation Given 11/20/22 1835)  ipratropium-albuterol (DUONEB) 0.5-2.5 (3) MG/3ML nebulizer solution 3 mL (3 mLs Nebulization Given 11/20/22 1716)  predniSONE (DELTASONE) tablet 60 mg (60 mg Oral Given 11/20/22 1745)  ipratropium-albuterol (DUONEB) 0.5-2.5 (3) MG/3ML nebulizer solution 3 mL (3 mLs Nebulization Given 11/20/22 1801)    ED Course/ Medical Decision Making/ A&P                           Medical Decision Making Amount and/or Complexity of Data Reviewed Radiology: ordered.  Risk Prescription drug management.   57 yo M with a chief complaints of what sounds like an asthma exacerbation.  It sounds like he has pretty persistent asthma has been seen in the ED few times for this in the past.  Most recently December 20 was given beta agonist therapy and had some improvement.  He has a scheduled appointment to see the pulmonologist on the 17th.  Will give 3 DuoNebs back-to-back and steroids and reassess.  Patient doing a bit better on repeat assessment.  Feels better about going home.  Burst of steroids.  Pulmonology follow-up.  Chest x-ray independently turbid by me without focal infiltrate or pneumothorax.  7:46 PM:  I have discussed the diagnosis/risks/treatment options with the patient and family.  Evaluation and diagnostic testing in the emergency department does not suggest an emergent condition requiring admission or immediate intervention beyond what has been performed at this time.  They will follow up with PCP, pulm. We also discussed returning to the ED immediately if new or worsening sx occur. We discussed the sx which are most concerning (e.g., sudden worsening sob, fever, inability to tolerate by mouth, need to use beta agonist more often then every 4 hours) that necessitate immediate return. Medications administered to the patient during their visit and any new prescriptions provided to the patient are listed below.  Medications given  during this visit Medications  albuterol (VENTOLIN HFA) 108 (90 Base) MCG/ACT inhaler 2 puff (2 puffs Inhalation Given 11/20/22 1835)  ipratropium-albuterol (DUONEB) 0.5-2.5 (3) MG/3ML nebulizer solution 3 mL (3 mLs Nebulization Given 11/20/22 1716)  predniSONE (DELTASONE) tablet 60 mg (60 mg Oral Given 11/20/22 1745)  ipratropium-albuterol (DUONEB) 0.5-2.5 (3) MG/3ML nebulizer solution 3 mL (3 mLs Nebulization Given 11/20/22 1801)     The patient appears reasonably screen and/or stabilized for discharge and I doubt any other medical condition or other [facility] requiring further screening, evaluation, or treatment in the  ED at this time prior to discharge.           Final Clinical Impression(s) / ED Diagnoses Final diagnoses:  Moderate persistent asthma with exacerbation    Rx / DC Orders ED Discharge Orders          Ordered    predniSONE (DELTASONE) 20 MG tablet        11/20/22 1823              Melene Plan, DO 11/20/22 1946

## 2022-11-20 NOTE — ED Triage Notes (Signed)
Pt states worsening SOB since 12/20 when he was see here  States productive cough continued as well  Denies pain  Using inhaler q 6 hrs   Respiratory at bedside  Increased WOB noted with exertion

## 2022-11-20 NOTE — ED Notes (Signed)
Pt discharged to home. Discharge instructions have been discussed with patient and/or family members. Pt verbally acknowledges understanding d/c instructions, and endorses comprehension to checkout at registration before leaving.  °

## 2022-12-01 ENCOUNTER — Encounter: Payer: Self-pay | Admitting: Pulmonary Disease

## 2022-12-01 ENCOUNTER — Ambulatory Visit (INDEPENDENT_AMBULATORY_CARE_PROVIDER_SITE_OTHER): Payer: Self-pay | Admitting: Pulmonary Disease

## 2022-12-01 VITALS — BP 136/74 | HR 95 | Temp 98.2°F | Ht 71.0 in | Wt 245.4 lb

## 2022-12-01 DIAGNOSIS — J4550 Severe persistent asthma, uncomplicated: Secondary | ICD-10-CM

## 2022-12-01 MED ORDER — TRELEGY ELLIPTA 200-62.5-25 MCG/ACT IN AEPB: 1.0000 | INHALATION_SPRAY | Freq: Every day | RESPIRATORY_TRACT | 0 refills | Status: DC

## 2022-12-01 NOTE — Patient Instructions (Signed)
Nice to meet you  Continue the nebulizer 4 times a day for the next week or so.  Finish prednisone as prescribed  Use Trelegy 1 puff once a day.  Rinse your mouth out with water and spit after every use.  I provided enough samples for 4 weeks.  Also a free 30-day supply, I sent this to the pharmacy.  In about a week, see if you can decrease the nebulizer solution to as needed if you are feeling better  We will fill out paperwork today for manufacturing assistance.    Return to clinic in 6 weeks or sooner as needed with Dr. Silas Flood

## 2022-12-09 NOTE — Progress Notes (Signed)
@Patient  ID: Rodney Mitchell, male    DOB: [birth year], 57 y.o.   MRN: [id number]  Chief Complaint  Patient presents with   Consult    Pt is new consult and went to the hospital several times related to asthma. Pt is only taking albuterol as needed. Pt states that the albuterol does help a little bit. CXR on 11/20/22. No daily inhaler Pt is on prednisone taper at the moment.     Referring provider: No ref. provider found  HPI:   57 y.o. man presents to clinic for evaluation of ongoing cough, bronchitis, recurrent what sounds like asthma flare.  ED note 11/03/2022 and 12/04/2022 reviewed.  Discharge summary from recent hospitalization at [facility] 11/2022 reviewed.  Several weeks to months history of cough and shortness of breath.  Associated with wheeze.  Coughing fits get very severe.  He has been seen in the outpatient setting a couple times.  Usually given steroids and an albuterol inhaler.  Steroids seem to help for a few days but symptoms come back.  He was admitted with similar symptoms recently at [facility].  Given similar treatment.  Discharged for couple of days.  Currently on steroids.  Inhalers helped some.  No other alleviating or exacerbating factors.  No time of day when things are better or worse.  No position make it better or worse.  No seasonal or environmental factors he can add vitamin things better or worse.  Chest x-ray 11/03/2022 and 12/04/2012 both reviewed, personally interpreted as clear lungs bilaterally.   Questionaires / Pulmonary Flowsheets:   ACT:      No data to display          MMRC:     No data to display          Epworth:      No data to display          Tests:   FENO:  No results found for: "NITRICOXIDE"  PFT:     No data to display          WALK:      No data to display          Imaging: Personally reviewed and as per EMR discussion this note DG Chest 2 View  Result Date: 11/20/2022 CLINICAL DATA:  SOB EXAM: CHEST - 2 VIEW  COMPARISON:  11/03/2022 FINDINGS: Cardiac silhouette is unremarkable. No pneumothorax or pleural effusion. The lungs are clear. The visualized skeletal structures are unremarkable. IMPRESSION: No acute cardiopulmonary process. Electronically Signed   By: Sammie Bench M.D.   On: 11/20/2022 17:31    Lab Results: Personally reviewed CBC    Component Value Date/Time   WBC 7.2 11/29/2012 0801   RBC 4.43 11/29/2012 0801   HGB 13.3 11/29/2012 0801   HCT 38.6 (L) 11/29/2012 0801   PLT 212 11/29/2012 0801   MCV 87.1 11/29/2012 0801   MCH 30.0 11/29/2012 0801   MCHC 34.5 11/29/2012 0801   RDW 12.6 11/29/2012 0801    BMET    Component Value Date/Time   NA 140 11/29/2012 0801   K 3.8 11/29/2012 0801   CL 104 11/29/2012 0801   CO2 28 11/29/2012 0801   GLUCOSE 110 (H) 11/29/2012 0801   BUN 16 11/29/2012 0801   CREATININE 0.84 11/29/2012 0801   CALCIUM 8.7 11/29/2012 0801   GFRNONAA >90 11/29/2012 0801   GFRAA >90 11/29/2012 0801    BNP No results found for: "BNP"  ProBNP No results found for: "PROBNP"  Specialty Problems   None   No Known Allergies   There is no immunization history on file for this patient.  Past Medical History:  Diagnosis Date   Kidney stones     Tobacco History: Social History   Tobacco Use  Smoking Status Never  Smokeless Tobacco Never   Counseling given: Not Answered   Continue to not smoke  Outpatient Encounter Medications as of 12/01/2022  Medication Sig   albuterol (PROVENTIL) (2.5 MG/3ML) 0.083% nebulizer solution Take 3 mLs (2.5 mg total) by nebulization every 6 (six) hours as needed for wheezing or shortness of breath.   budesonide (PULMICORT) 0.5 MG/2ML nebulizer solution Inhale into the lungs.   Fluticasone-Umeclidin-Vilant (TRELEGY ELLIPTA) 200-62.5-25 MCG/ACT AEPB Inhale 1 puff into the lungs daily.   HYDROmorphone (DILAUDID) 2 MG tablet Take 1 tablet (2 mg total) by mouth every 4 (four) hours as needed for severe pain.    ondansetron (ZOFRAN ODT) 8 MG disintegrating tablet Take 1 tablet (8 mg total) by mouth every 8 (eight) hours as needed for nausea or vomiting.   predniSONE (DELTASONE) 20 MG tablet 2 tabs po daily x 4 days   No facility-administered encounter medications on file as of 12/01/2022.     Review of Systems  Review of Systems  No chest pain with exertion.  No orthopnea or PND.  Comprehensive review of systems otherwise negative. Physical Exam  BP 136/74 (BP Location: Left Arm, Patient Position: Sitting, Cuff Size: Normal)   Pulse 95   Temp 98.2 F (36.8 C) (Oral)   Ht 5\' 11"  (1.803 m)   Wt 245 lb 6.4 oz (111.3 kg)   SpO2 94%   BMI 34.23 kg/m   Wt Readings from Last 5 Encounters:  12/01/22 245 lb 6.4 oz (111.3 kg)  11/20/22 239 lb 13.8 oz (108.8 kg)  11/03/22 240 lb (108.9 kg)  05/31/18 245 lb (111.1 kg)    BMI Readings from Last 5 Encounters:  12/01/22 34.23 kg/m  11/20/22 33.45 kg/m  11/03/22 33.47 kg/m  05/31/18 35.15 kg/m     Physical Exam General: Sitting in chair, in no acute distress Eyes: EOMI, no icterus Neck: Supple, no JVP Pulmonary: Diffuse scattered mild wheeze with exhalation, normal work of breathing Cardiovascular: Warm, no edema Abdomen: Nondistended, bowel sounds present MSK: No synovitis, no joint effusion Neuro: Normal gait, no weakness Psych: Normal mood, full affect   Assessment & Plan:   Severe persistent asthma: Multiple exacerbations and ED visits.  Unfortunately, he is uninsured.  Poor access to medication.  Given severe exacerbations triple inhaled therapy warranted.  High-dose Trelegy provided samples in addition of a coupon for free 30-day supply.  In addition, manufacturing assistance paperwork provided today.  Continue nebulized therapies 4 times daily for now but once he start Trelegy next week advised start decreasing gradually and eventually down to as needed.  Continue albuterol HFA as needed.  Finish course of prednisone recently  prescribed.   Return in about 6 weeks (around 01/12/2023).   Lanier Clam, MD 12/09/2022   This appointment required 60 minutes of patient care (this includes precharting, chart review, review of results, face-to-face care, etc.).

## 2023-07-14 ENCOUNTER — Encounter: Payer: Self-pay | Admitting: Pulmonary Disease

## 2023-07-14 ENCOUNTER — Ambulatory Visit (INDEPENDENT_AMBULATORY_CARE_PROVIDER_SITE_OTHER): Payer: Managed Care, Other (non HMO) | Admitting: Pulmonary Disease

## 2023-07-14 VITALS — BP 120/80 | HR 90 | Ht 71.0 in | Wt 249.2 lb

## 2023-07-14 DIAGNOSIS — R042 Hemoptysis: Secondary | ICD-10-CM | POA: Diagnosis not present

## 2023-07-14 MED ORDER — BUDESONIDE-FORMOTEROL FUMARATE 160-4.5 MCG/ACT IN AERO: 2.0000 | INHALATION_SPRAY | Freq: Two times a day (BID) | RESPIRATORY_TRACT | 12 refills | Status: AC

## 2023-07-14 NOTE — Patient Instructions (Signed)
Nice to see you again  No need to go back on the old inhaler since it did not seem to help  Try Symbicort 2 puffs in the morning and 2 puffs in the evening, rinse your mouth out with water after every use.  Use with a spacer, the plastic tube provided.  If this is too expensive please let me know and I will look for a solution  I have ordered a CT scan of your chest to evaluate the blood in your phlegm.  Return to clinic in 2 months or sooner as needed with Dr. Judeth Horn

## 2023-07-14 NOTE — Progress Notes (Signed)
@Patient  ID: Rodney Mitchell, male    DOB: [birth year], 57 y.o.   MRN: [id number]  Chief Complaint  Patient presents with   Follow-up    Bleed in phlegm, sore throat sometimes. Pt has not been using inhaler.     Referring provider: No ref. provider found  HPI:   57 y.o. man presents with history of presumed asthma with bouts of bronchitis relieved with albuterol and prednisone here with hemoptysis.  He denies really any issues with cough.  He has a lot of congestion, phlegm in the back of his throat in the morning.  When he clears this out it is blood-tinged.  No frank hemoptysis.  Happens most days for the last 3 months.  Shortness of breath at baseline.  Again no worsening cough etc.  Denies history of TB.  No teeth or gum bleeding.  No nose bleeds etc.  HPI at initial visit: Several weeks to months history of cough and shortness of breath.  Associated with wheeze.  Coughing fits get very severe.  He has been seen in the outpatient setting a couple times.  Usually given steroids and an albuterol inhaler.  Steroids seem to help for a few days but symptoms come back.  He was admitted with similar symptoms recently at [facility].  Given similar treatment.  Discharged for couple of days.  Currently on steroids.  Inhalers helped some.  No other alleviating or exacerbating factors.  No time of day when things are better or worse.  No position make it better or worse.  No seasonal or environmental factors he can add vitamin things better or worse.  Chest x-ray 11/03/2022 and 12/04/2012 both reviewed, personally interpreted as clear lungs bilaterally.   Questionaires / Pulmonary Flowsheets:   ACT:      No data to display          MMRC:     No data to display          Epworth:      No data to display          Tests:   FENO:  No results found for: "NITRICOXIDE"  PFT:     No data to display          WALK:      No data to display           Imaging: Personally reviewed and as per EMR discussion this note No results found.  Lab Results: Personally reviewed CBC    Component Value Date/Time   WBC 7.2 11/29/2012 0801   RBC 4.43 11/29/2012 0801   HGB 13.3 11/29/2012 0801   HCT 38.6 (L) 11/29/2012 0801   PLT 212 11/29/2012 0801   MCV 87.1 11/29/2012 0801   MCH 30.0 11/29/2012 0801   MCHC 34.5 11/29/2012 0801   RDW 12.6 11/29/2012 0801    BMET    Component Value Date/Time   NA 140 11/29/2012 0801   K 3.8 11/29/2012 0801   CL 104 11/29/2012 0801   CO2 28 11/29/2012 0801   GLUCOSE 110 (H) 11/29/2012 0801   BUN 16 11/29/2012 0801   CREATININE 0.84 11/29/2012 0801   CALCIUM 8.7 11/29/2012 0801   GFRNONAA >90 11/29/2012 0801   GFRAA >90 11/29/2012 0801    BNP No results found for: "BNP"  ProBNP No results found for: "PROBNP"  Specialty Problems   None   No Known Allergies   There is no immunization history on file for this patient.  Past Medical  History:  Diagnosis Date   Kidney stones     Tobacco History: Social History   Tobacco Use  Smoking Status Never  Smokeless Tobacco Never   Counseling given: Not Answered   Continue to not smoke  Outpatient Encounter Medications as of 07/14/2023  Medication Sig   albuterol (PROVENTIL) (2.5 MG/3ML) 0.083% nebulizer solution Take 3 mLs (2.5 mg total) by nebulization every 6 (six) hours as needed for wheezing or shortness of breath.   budesonide-formoterol (SYMBICORT) 160-4.5 MCG/ACT inhaler Inhale 2 puffs into the lungs in the morning and at bedtime.   HYDROmorphone (DILAUDID) 2 MG tablet Take 1 tablet (2 mg total) by mouth every 4 (four) hours as needed for severe pain.   ondansetron (ZOFRAN ODT) 8 MG disintegrating tablet Take 1 tablet (8 mg total) by mouth every 8 (eight) hours as needed for nausea or vomiting.   predniSONE (DELTASONE) 20 MG tablet 2 tabs po daily x 4 days (Patient not taking: Reported on 07/14/2023)   [DISCONTINUED]  Fluticasone-Umeclidin-Vilant (TRELEGY ELLIPTA) 200-62.5-25 MCG/ACT AEPB Inhale 1 puff into the lungs daily. (Patient not taking: Reported on 07/14/2023)   No facility-administered encounter medications on file as of 07/14/2023.     Review of Systems  Review of Systems  N/a Physical Exam  BP 120/80   Pulse 90   Ht 5\' 11"  (1.803 m)   Wt 249 lb 3.2 oz (113 kg)   SpO2 96%   BMI 34.76 kg/m   Wt Readings from Last 5 Encounters:  07/14/23 249 lb 3.2 oz (113 kg)  12/01/22 245 lb 6.4 oz (111.3 kg)  11/20/22 239 lb 13.8 oz (108.8 kg)  11/03/22 240 lb (108.9 kg)  05/31/18 245 lb (111.1 kg)    BMI Readings from Last 5 Encounters:  07/14/23 34.76 kg/m  12/01/22 34.23 kg/m  11/20/22 33.45 kg/m  11/03/22 33.47 kg/m  05/31/18 35.15 kg/m     Physical Exam General: Sitting in chair, in no acute distress Eyes: EOMI, no icterus Neck: Supple, no JVP Pulmonary: Clear, normal work of breathing Cardiovascular: Warm, no edema Abdomen: Nondistended, bowel sounds present MSK: No synovitis, no joint effusion Neuro: Normal gait, no weakness Psych: Normal mood, full affect   Assessment & Plan:   Severe persistent asthma: Multiple exacerbations and ED visits.  Fortunately gradually improved with time.  Normal Trelegy anymore.  Did not think it helped much.  With some phlegm in the morning benign postnasal drip etc.  Trial Symbicort to see if HFA better than DPI.  Spacer provided today.  Hemoptysis: Clearing of phlegm in the morning not a lot of cough.  But blood-tinged.  Denies postnasal drip.  No other source of bleeding on questioning.  CT chest without contrast for further evaluation, rule out mass etc.   Return in about 2 months (around 09/13/2023).   Karren Burly, MD 07/14/2023

## 2023-07-20 ENCOUNTER — Inpatient Hospital Stay: Admission: RE | Admit: 2023-07-20 | Payer: Managed Care, Other (non HMO) | Source: Ambulatory Visit

## 2023-09-19 ENCOUNTER — Ambulatory Visit (INDEPENDENT_AMBULATORY_CARE_PROVIDER_SITE_OTHER): Payer: Managed Care, Other (non HMO) | Admitting: Pulmonary Disease

## 2023-09-19 ENCOUNTER — Encounter: Payer: Self-pay | Admitting: Pulmonary Disease

## 2023-09-19 VITALS — BP 135/77 | HR 74 | Ht 71.0 in | Wt 250.4 lb

## 2023-09-19 DIAGNOSIS — R042 Hemoptysis: Secondary | ICD-10-CM

## 2023-09-19 NOTE — Progress Notes (Signed)
@Patient  ID: Rodney Mitchell, male    DOB: 02/10/1966, 57 y.o.   MRN: [id number]  Chief Complaint  Patient presents with   Follow-up    58 month follow up    Referring provider: No ref. provider found  HPI:   57 y.o. man presents with history of presumed asthma with bouts of bronchitis relieved with albuterol and prednisone here with hemoptysis.  Returns for routine follow-up.  Cough.  Still having hemoptysis or clearing throat with blood in the morning.  Denies nasal blood or nosebleed.  But daily has scant hemoptysis it sounds like.  Unclear if true cough or clearing throat.  CT scan was scheduled 9//2024 but he no showed this.  In terms of cough overall is improved with Symbicort.  Still residual symptoms but better.  HPI at initial visit: Several weeks to months history of cough and shortness of breath.  Associated with wheeze.  Coughing fits get very severe.  He has been seen in the outpatient setting a couple times.  Usually given steroids and an albuterol inhaler.  Steroids seem to help for a few days but symptoms come back.  He was admitted with similar symptoms recently at [facility].  Given similar treatment.  Discharged for couple of days.  Currently on steroids.  Inhalers helped some.  No other alleviating or exacerbating factors.  No time of day when things are better or worse.  No position make it better or worse.  No seasonal or environmental factors he can add vitamin things better or worse.  Chest x-ray 11/03/2022 and 12/04/2012 both reviewed, personally interpreted as clear lungs bilaterally.   Questionaires / Pulmonary Flowsheets:   ACT:      No data to display          MMRC:     No data to display          Epworth:      No data to display          Tests:   FENO:  No results found for: "NITRICOXIDE"  PFT:     No data to display          WALK:      No data to display          Imaging: Personally reviewed and as per EMR  discussion this note No results found.  Lab Results: Personally reviewed CBC    Component Value Date/Time   WBC 7.2 11/29/2012 0801   RBC 4.43 11/29/2012 0801   HGB 13.3 11/29/2012 0801   HCT 38.6 (L) 11/29/2012 0801   PLT 212 11/29/2012 0801   MCV 87.1 11/29/2012 0801   MCH 30.0 11/29/2012 0801   MCHC 34.5 11/29/2012 0801   RDW 12.6 11/29/2012 0801    BMET    Component Value Date/Time   NA 140 11/29/2012 0801   K 3.8 11/29/2012 0801   CL 104 11/29/2012 0801   CO2 28 11/29/2012 0801   GLUCOSE 110 (H) 11/29/2012 0801   BUN 16 11/29/2012 0801   CREATININE 0.84 11/29/2012 0801   CALCIUM 8.7 11/29/2012 0801   GFRNONAA >90 11/29/2012 0801   GFRAA >90 11/29/2012 0801    BNP No results found for: "BNP"  ProBNP No results found for: "PROBNP"  Specialty Problems   None   No Known Allergies   There is no immunization history on file for this patient.  Past Medical History:  Diagnosis Date   Kidney stones     Tobacco History: Social  History   Tobacco Use  Smoking Status Never  Smokeless Tobacco Never   Counseling given: Not Answered   Continue to not smoke  Outpatient Encounter Medications as of 09/19/2023  Medication Sig   albuterol (PROVENTIL) (2.5 MG/3ML) 0.083% nebulizer solution Take 3 mLs (2.5 mg total) by nebulization every 6 (six) hours as needed for wheezing or shortness of breath.   budesonide-formoterol (SYMBICORT) 160-4.5 MCG/ACT inhaler Inhale 2 puffs into the lungs in the morning and at bedtime.   HYDROmorphone (DILAUDID) 2 MG tablet Take 1 tablet (2 mg total) by mouth every 4 (four) hours as needed for severe pain.   ondansetron (ZOFRAN ODT) 8 MG disintegrating tablet Take 1 tablet (8 mg total) by mouth every 8 (eight) hours as needed for nausea or vomiting.   [DISCONTINUED] predniSONE (DELTASONE) 20 MG tablet 2 tabs po daily x 4 days (Patient not taking: Reported on 09/19/2023)   No facility-administered encounter medications on file as of  09/19/2023.     Review of Systems  Review of Systems  N/a Physical Exam  BP 135/77   Pulse 74   Ht 5\' 11"  (1.803 m)   Wt 250 lb 6.4 oz (113.6 kg)   SpO2 95%   BMI 34.92 kg/m   Wt Readings from Last 5 Encounters:  09/19/23 250 lb 6.4 oz (113.6 kg)  07/14/23 249 lb 3.2 oz (113 kg)  12/01/22 245 lb 6.4 oz (111.3 kg)  11/20/22 239 lb 13.8 oz (108.8 kg)  11/03/22 240 lb (108.9 kg)    BMI Readings from Last 5 Encounters:  09/19/23 34.92 kg/m  07/14/23 34.76 kg/m  12/01/22 34.23 kg/m  11/20/22 33.45 kg/m  11/03/22 33.47 kg/m     Physical Exam General: Sitting in chair, in no acute distress Eyes: EOMI, no icterus Neck: Supple, no JVP Pulmonary: Clear, normal work of breathing Cardiovascular: Warm, no edema Abdomen: Nondistended, bowel sounds present MSK: No synovitis, no joint effusion Neuro: Normal gait, no weakness Psych: Normal mood, full affect   Assessment & Plan:   Severe persistent asthma: Multiple exacerbations and ED visits.  Fortunately gradually improved with time.  Not on Trelegy anymore.  Did not think it helped much.  With some phlegm in the morning, some postnasal drip etc.  Symbicort with improvement in cough overall.  Minimal dyspnea.  Hemoptysis: Clearing of phlegm in the morning not a lot of cough.  But blood-tinged.  Denies postnasal drip.  No other source of bleeding on questioning.  CT chest without contrast for further evaluation, rule out mass etc. this was scheduled and he no showed 07/20/2023.  New order placed today.   Return in about 6 months (around 03/18/2024) for f/u Dr. Judeth Horn.   Karren Burly, MD 09/19/2023

## 2023-09-23 ENCOUNTER — Other Ambulatory Visit (HOSPITAL_BASED_OUTPATIENT_CLINIC_OR_DEPARTMENT_OTHER): Payer: Managed Care, Other (non HMO)

## 2023-09-23 ENCOUNTER — Ambulatory Visit
Admission: RE | Admit: 2023-09-23 | Discharge: 2023-09-23 | Disposition: A | Discharge status: Home / Self Care | Payer: Managed Care, Other (non HMO) | Source: Ambulatory Visit | Attending: Pulmonary Disease | Admitting: Pulmonary Disease

## 2023-09-23 DIAGNOSIS — R042 Hemoptysis: Secondary | ICD-10-CM
